# Patient Record
Sex: Male | Born: 2000
Health system: Southern US, Community
[De-identification: ages and names within clinical notes are randomized; demographics above are authoritative.]

---
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
## 2020-01-11 DIAGNOSIS — F411 Generalized anxiety disorder: Secondary | ICD-10-CM | POA: Diagnosis not present

## 2020-01-11 DIAGNOSIS — F4011 Social phobia, generalized: Secondary | ICD-10-CM | POA: Diagnosis not present

## 2020-01-11 DIAGNOSIS — F845 Asperger's syndrome: Secondary | ICD-10-CM | POA: Diagnosis not present

## 2020-02-01 DIAGNOSIS — F411 Generalized anxiety disorder: Secondary | ICD-10-CM | POA: Diagnosis not present

## 2020-02-01 DIAGNOSIS — F845 Asperger's syndrome: Secondary | ICD-10-CM | POA: Diagnosis not present

## 2020-02-01 DIAGNOSIS — F4011 Social phobia, generalized: Secondary | ICD-10-CM | POA: Diagnosis not present

## 2020-02-22 DIAGNOSIS — F4011 Social phobia, generalized: Secondary | ICD-10-CM | POA: Diagnosis not present

## 2020-02-22 DIAGNOSIS — F845 Asperger's syndrome: Secondary | ICD-10-CM | POA: Diagnosis not present

## 2020-02-22 DIAGNOSIS — F411 Generalized anxiety disorder: Secondary | ICD-10-CM | POA: Diagnosis not present

## 2020-03-21 DIAGNOSIS — F411 Generalized anxiety disorder: Secondary | ICD-10-CM | POA: Diagnosis not present

## 2020-03-21 DIAGNOSIS — F4011 Social phobia, generalized: Secondary | ICD-10-CM | POA: Diagnosis not present

## 2020-03-21 DIAGNOSIS — F845 Asperger's syndrome: Secondary | ICD-10-CM | POA: Diagnosis not present

## 2020-04-18 DIAGNOSIS — F4011 Social phobia, generalized: Secondary | ICD-10-CM | POA: Diagnosis not present

## 2020-04-18 DIAGNOSIS — F411 Generalized anxiety disorder: Secondary | ICD-10-CM | POA: Diagnosis not present

## 2020-04-18 DIAGNOSIS — F845 Asperger's syndrome: Secondary | ICD-10-CM | POA: Diagnosis not present

## 2020-05-16 DIAGNOSIS — F411 Generalized anxiety disorder: Secondary | ICD-10-CM | POA: Diagnosis not present

## 2020-05-16 DIAGNOSIS — F4011 Social phobia, generalized: Secondary | ICD-10-CM | POA: Diagnosis not present

## 2020-05-16 DIAGNOSIS — F845 Asperger's syndrome: Secondary | ICD-10-CM | POA: Diagnosis not present

## 2020-06-13 DIAGNOSIS — F4011 Social phobia, generalized: Secondary | ICD-10-CM | POA: Diagnosis not present

## 2020-06-13 DIAGNOSIS — F845 Asperger's syndrome: Secondary | ICD-10-CM | POA: Diagnosis not present

## 2020-06-13 DIAGNOSIS — F411 Generalized anxiety disorder: Secondary | ICD-10-CM | POA: Diagnosis not present

## 2020-07-11 DIAGNOSIS — F411 Generalized anxiety disorder: Secondary | ICD-10-CM | POA: Diagnosis not present

## 2020-07-11 DIAGNOSIS — F4011 Social phobia, generalized: Secondary | ICD-10-CM | POA: Diagnosis not present

## 2020-07-11 DIAGNOSIS — F845 Asperger's syndrome: Secondary | ICD-10-CM | POA: Diagnosis not present

## 2020-07-15 ENCOUNTER — Ambulatory Visit: Payer: BC Managed Care – PPO | Admitting: Psychiatry

## 2020-07-16 ENCOUNTER — Ambulatory Visit (INDEPENDENT_AMBULATORY_CARE_PROVIDER_SITE_OTHER): Payer: BC Managed Care – PPO | Admitting: Psychiatry

## 2020-07-16 ENCOUNTER — Other Ambulatory Visit: Payer: Self-pay

## 2020-07-16 ENCOUNTER — Encounter: Payer: Self-pay | Admitting: Psychiatry

## 2020-07-16 VITALS — Ht 72.0 in | Wt 125.0 lb

## 2020-07-16 DIAGNOSIS — F325 Major depressive disorder, single episode, in full remission: Secondary | ICD-10-CM | POA: Diagnosis not present

## 2020-07-16 DIAGNOSIS — F4010 Social phobia, unspecified: Secondary | ICD-10-CM | POA: Diagnosis not present

## 2020-07-16 DIAGNOSIS — F840 Autistic disorder: Secondary | ICD-10-CM | POA: Diagnosis not present

## 2020-07-16 NOTE — Progress Notes (Signed)
[facility]  Patient ID: Eric Robbins,  MRN: [id number]  PCP: Eric Ladd, MD  Date of Evaluation: 07/16/2020 Time spent:15 minutes from 1005 to 1020  Chief Complaint:  Chief Complaint    Anxiety; Paranoid; Depression      HISTORY/CURRENT STATUS: Eric Robbins is seen onsite in office 15 minutes face-to-face conjointly with father with consent with epic collateral for adolescent psychiatric interview and exam in 67-month evaluation and management of social anxiety, major depression in remission, and autism spectrum.  In the last year, Eric Robbins continued Celexa 20 mg daily and takes approximately 1 Xanax 0.5 mg tablet yearly.  Eric Robbins registry documents last dispensing for Xanax to be at time of last appointment 07/17/2019 #60 tablets rarely taking such as when he is too anxious to carry out an activity he needs to acquire.  He does not need a car to drive yet.  He may have gotten high school diploma but notes he graduated from [employer] last month.  He stopped Celexa somewhere early to mid June with father noting improvement off of the Celexa talking more with more energy, interest and confidence, though after he has graduated school.  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  He has monthly sessions with Dr. Denman Robbins virtually currently.  He has no plans yet for [facility] such as apprenticeship.  He has had no depression in the interim.  He has no mania, suicidality, psychosis or delirium.   Individual Medical History/ Review of Systems: Changes? :Yes   Allergies: Patient has no known allergies.  Current Medications:  Current Outpatient Medications:  .  ALPRAZolam (XANAX) 0.5 MG tablet, Take 1 tablet (0.5 mg total) by mouth 2 (two) times daily as needed for anxiety., Disp: 60 tablet, Rfl: 0  Medication Side Effects: confusion  relatively from Celexa so once he graduated he he was more social off Celexa  Family Medical/ Social History: Changes? No  MENTAL HEALTH EXAM:  Height 6' (1.829 m), weight 125 lb (56.7 kg).Body mass index is 16.95 kg/m. Muscle strengths and tone 5/5, postural reflexes and gait 0/0, and AIMS = 0.  General Appearance: Casual, Fairly Groomed and Guarded  Eye Contact:  Fair  Speech:  Clear and Coherent and Normal Rate  Volume:  Normal  Mood:  Anxious and Euthymic  Affect:  Congruent, Inappropriate, Restricted and Anxious  Thought Process:  Coherent, Irrelevant, Linear and Descriptions of Associations: Tangential  Orientation:  Full (Time, Place, and Person)  Thought Content: Ilusions, Rumination and Tangential   Suicidal Thoughts:  No  Homicidal Thoughts:  No  Memory:  Immediate;   Good Remote;   Fair  Judgement:  Fair  Insight:  Shallow  Psychomotor Activity:  Normal, Decreased and Mannerisms  Concentration:  Concentration: Fair and Attention Span: Fair  Recall:  [employer] of Knowledge: Fair  Language: Fair  Assets:  Leisure Time Resilience Talents/Skills  ADL's:  Intact  Cognition: WNL  Prognosis:  Fair    DIAGNOSES:    ICD-10-CM   1. Social anxiety disorder  F40.10   2. Major depressive disorder, single episode, in full remission ([facility])  F32.5   3. Autistic spectrum disorder  F84.0     Receiving Psychotherapy: Yes with Eric Shadow, PhD every month   RECOMMENDATIONS: Psychosupportive psychoeducation interactively family structurally consolidates symptom treatment matching for continuing  his discontinuation of Celexa from last month.  He does have Xanax 0.5 mg twice daily as needed current supply from 1 year ago #60 likely taking no more than 1 or 2  yearly.  He continues therapy with Dr. Denman Robbins and patient and  family prefer return again in 1 year or sooner if needed.  We reinforce and consolidate his progress with congratulations and next step options.   Eric Mann, MD

## 2020-10-15 ENCOUNTER — Encounter: Payer: Self-pay | Admitting: Psychiatry

## 2021-07-16 ENCOUNTER — Ambulatory Visit: Payer: BC Managed Care – PPO | Admitting: Psychiatry

## 2022-02-05 DIAGNOSIS — F411 Generalized anxiety disorder: Secondary | ICD-10-CM | POA: Diagnosis not present

## 2022-02-05 DIAGNOSIS — F845 Asperger's syndrome: Secondary | ICD-10-CM | POA: Diagnosis not present

## 2022-02-05 DIAGNOSIS — F4011 Social phobia, generalized: Secondary | ICD-10-CM | POA: Diagnosis not present

## 2022-04-02 DIAGNOSIS — F411 Generalized anxiety disorder: Secondary | ICD-10-CM | POA: Diagnosis not present

## 2022-04-02 DIAGNOSIS — F4011 Social phobia, generalized: Secondary | ICD-10-CM | POA: Diagnosis not present

## 2022-04-02 DIAGNOSIS — F845 Asperger's syndrome: Secondary | ICD-10-CM | POA: Diagnosis not present

## 2022-06-04 DIAGNOSIS — F411 Generalized anxiety disorder: Secondary | ICD-10-CM | POA: Diagnosis not present

## 2022-06-04 DIAGNOSIS — F845 Asperger's syndrome: Secondary | ICD-10-CM | POA: Diagnosis not present

## 2022-06-04 DIAGNOSIS — F4011 Social phobia, generalized: Secondary | ICD-10-CM | POA: Diagnosis not present

## 2022-07-30 DIAGNOSIS — F845 Asperger's syndrome: Secondary | ICD-10-CM | POA: Diagnosis not present

## 2022-07-30 DIAGNOSIS — F4011 Social phobia, generalized: Secondary | ICD-10-CM | POA: Diagnosis not present

## 2022-07-30 DIAGNOSIS — F411 Generalized anxiety disorder: Secondary | ICD-10-CM | POA: Diagnosis not present

## 2022-09-24 DIAGNOSIS — F411 Generalized anxiety disorder: Secondary | ICD-10-CM | POA: Diagnosis not present

## 2022-09-24 DIAGNOSIS — F4011 Social phobia, generalized: Secondary | ICD-10-CM | POA: Diagnosis not present

## 2022-09-24 DIAGNOSIS — F845 Asperger's syndrome: Secondary | ICD-10-CM | POA: Diagnosis not present

## 2022-10-21 ENCOUNTER — Emergency Department (HOSPITAL_COMMUNITY)
Admission: EM | Admit: 2022-10-21 | Discharge: 2022-10-21 | Disposition: A | Discharge status: Home / Self Care | Payer: BC Managed Care – PPO | Attending: Emergency Medicine | Admitting: Emergency Medicine

## 2022-10-21 ENCOUNTER — Other Ambulatory Visit: Payer: Self-pay

## 2022-10-21 ENCOUNTER — Encounter (HOSPITAL_COMMUNITY): Payer: Self-pay | Admitting: Emergency Medicine

## 2022-10-21 DIAGNOSIS — R1031 Right lower quadrant pain: Secondary | ICD-10-CM | POA: Insufficient documentation

## 2022-10-21 DIAGNOSIS — F840 Autistic disorder: Secondary | ICD-10-CM | POA: Diagnosis not present

## 2022-10-21 DIAGNOSIS — R112 Nausea with vomiting, unspecified: Secondary | ICD-10-CM | POA: Insufficient documentation

## 2022-10-21 DIAGNOSIS — E876 Hypokalemia: Secondary | ICD-10-CM | POA: Insufficient documentation

## 2022-10-21 DIAGNOSIS — D72829 Elevated white blood cell count, unspecified: Secondary | ICD-10-CM | POA: Diagnosis not present

## 2022-10-21 LAB — URINALYSIS, ROUTINE W REFLEX MICROSCOPIC
Bilirubin Urine: NEGATIVE
Glucose, UA: NEGATIVE mg/dL
Hgb urine dipstick: NEGATIVE
Ketones, ur: NEGATIVE mg/dL
Leukocytes,Ua: NEGATIVE
Nitrite: NEGATIVE
Protein, ur: NEGATIVE mg/dL
Specific Gravity, Urine: 1.031 — ABNORMAL HIGH (ref 1.005–1.030)
pH: 6 (ref 5.0–8.0)

## 2022-10-21 LAB — CBC WITH DIFFERENTIAL/PLATELET
Abs Immature Granulocytes: 0.03 10*3/uL (ref 0.00–0.07)
Basophils Absolute: 0.1 10*3/uL (ref 0.0–0.1)
Basophils Relative: 1 %
Eosinophils Absolute: 0.2 10*3/uL (ref 0.0–0.5)
Eosinophils Relative: 2 %
HCT: 45 % (ref 39.0–52.0)
Hemoglobin: 15.5 g/dL (ref 13.0–17.0)
Immature Granulocytes: 0 %
Lymphocytes Relative: 56 %
Lymphs Abs: 6 10*3/uL — ABNORMAL HIGH (ref 0.7–4.0)
MCH: 29.9 pg (ref 26.0–34.0)
MCHC: 34.4 g/dL (ref 30.0–36.0)
MCV: 86.7 fL (ref 80.0–100.0)
Monocytes Absolute: 0.9 10*3/uL (ref 0.1–1.0)
Monocytes Relative: 9 %
Neutro Abs: 3.4 10*3/uL (ref 1.7–7.7)
Neutrophils Relative %: 32 %
Platelets: 355 10*3/uL (ref 150–400)
RBC: 5.19 MIL/uL (ref 4.22–5.81)
RDW: 12 % (ref 11.5–15.5)
WBC: 10.6 10*3/uL — ABNORMAL HIGH (ref 4.0–10.5)
nRBC: 0 % (ref 0.0–0.2)

## 2022-10-21 LAB — COMPREHENSIVE METABOLIC PANEL
ALT: 23 U/L (ref 0–44)
AST: 25 U/L (ref 15–41)
Albumin: 3.9 g/dL (ref 3.5–5.0)
Alkaline Phosphatase: 66 U/L (ref 38–126)
Anion gap: 9 (ref 5–15)
BUN: 29 mg/dL — ABNORMAL HIGH (ref 6–20)
CO2: 25 mmol/L (ref 22–32)
Calcium: 8.7 mg/dL — ABNORMAL LOW (ref 8.9–10.3)
Chloride: 106 mmol/L (ref 98–111)
Creatinine, Ser: 1.45 mg/dL — ABNORMAL HIGH (ref 0.61–1.24)
GFR, Estimated: >60 mL/min (ref >60)
Glucose, Bld: 160 mg/dL — ABNORMAL HIGH (ref 70–99)
Potassium: 3.2 mmol/L — ABNORMAL LOW (ref 3.5–5.1)
Sodium: 140 mmol/L (ref 135–145)
Total Bilirubin: 1.1 mg/dL (ref 0.3–1.2)
Total Protein: 6.3 g/dL — ABNORMAL LOW (ref 6.5–8.1)

## 2022-10-21 LAB — LIPASE, BLOOD: Lipase: 38 U/L (ref 11–51)

## 2022-10-21 NOTE — ED Triage Notes (Signed)
Pt c/o RLQ abdominal pain and N/V x 1 hour

## 2022-10-21 NOTE — ED Provider Notes (Signed)
[facility] DEPT Provider Note   CSN: [id number] Arrival date & time: 10/21/22  0405     History  Chief Complaint  Patient presents with   Abdominal Pain    Eric Robbins is a 21 y.o. male.  The history is provided by the patient, a parent and medical records. No language interpreter was used.  Abdominal Pain    21 year old male significant history of anxiety, depression, autism, who presents with complaints of abdominal pain.  Patient report developing acute onset of pain to his right lower quadrant that started last night while he was sleeping.  States pain woke him up, described as a cramp sensation lasting for approximate 3 hours.  He did endorse feeling nauseous and did vomit during this episode.  Since being in the ED, his pain has subsided.  No report of fever but does endorse some mild chills.  No chest pain or shortness of breath productive cough no back pain, hematuria, penile discharge, penile pain, scrotal pain or testicle pain.  No specific treatment tried.  Reported currently pain-free.  No prior abdominal surgeries and patient has an intact appendix.  Home Medications Prior to Admission medications   Medication Sig Start Date End Date Taking? Authorizing Provider  ALPRAZolam Duanne Moron) 0.5 MG tablet Take 1 tablet (0.5 mg total) by mouth 2 (two) times daily as needed for anxiety. 07/17/19   Delight Hoh, MD      Allergies    Patient has no known allergies.    Review of Systems   Review of Systems  Gastrointestinal:  Positive for abdominal pain.  All other systems reviewed and are negative.   Physical Exam Updated Vital Signs BP 128/85 (BP Location: Left Arm)   Pulse 74   Temp 97.7 F (36.5 C) (Oral)   Resp 18   Ht 6' (1.829 m)   Wt 56.7 kg   SpO2 99%   BMI 16.95 kg/m  Physical Exam Vitals and nursing note reviewed.  Constitutional:      General: He is not in acute distress.    Appearance: He is well-developed.   HENT:     Head: Atraumatic.  Eyes:     Conjunctiva/sclera: Conjunctivae normal.  Cardiovascular:     Rate and Rhythm: Normal rate and regular rhythm.  Pulmonary:     Effort: Pulmonary effort is normal.     Breath sounds: Normal breath sounds.  Abdominal:     General: Abdomen is flat. Bowel sounds are normal.     Palpations: Abdomen is soft.     Tenderness: There is no abdominal tenderness. There is no right CVA tenderness or left CVA tenderness.     Hernia: No hernia is present.  Musculoskeletal:     Cervical back: Neck supple.  Skin:    Findings: No rash.  Neurological:     Mental Status: He is alert.     ED Results / Procedures / Treatments   Labs (all labs ordered are listed, but only abnormal results are displayed) Labs Reviewed  COMPREHENSIVE METABOLIC PANEL - Abnormal; Notable for the following components:      Result Value   Potassium 3.2 (*)    Glucose, Bld 160 (*)    BUN 29 (*)    Creatinine, Ser 1.45 (*)    Calcium 8.7 (*)    Total Protein 6.3 (*)    All other components within normal limits  CBC WITH DIFFERENTIAL/PLATELET - Abnormal; Notable for the following components:   WBC 10.6 (*)  Lymphs Abs 6.0 (*)    All other components within normal limits  URINALYSIS, ROUTINE W REFLEX MICROSCOPIC - Abnormal; Notable for the following components:   APPearance HAZY (*)    Specific Gravity, Urine 1.031 (*)    All other components within normal limits  LIPASE, BLOOD    EKG None  Radiology No results found.  Procedures Procedures    Medications Ordered in ED Medications - No data to display  ED Course/ Medical Decision Making/ A&P                           Medical Decision Making  BP 128/85 (BP Location: Left Arm)   Pulse 74   Temp 97.7 F (36.5 C) (Oral)   Resp 18   Ht 6' (1.829 m)   Wt 56.7 kg   SpO2 99%   BMI 16.95 kg/m   84:8 AM  21 year old male significant history of anxiety, depression, autism, who presents with complaints of  abdominal pain.  Patient report developing acute onset of pain to his right lower quadrant that started last night while he was sleeping.  States pain woke him up, described as a cramp sensation lasting for approximate 3 hours.  He did endorse feeling nauseous and did vomit during this episode.  Since being in the ED, his pain has subsided.  No report of fever but does endorse some mild chills.  No chest pain or shortness of breath productive cough no back pain, hematuria, penile discharge, penile pain, scrotal pain or testicle pain.  No specific treatment tried.  Reported currently pain-free.  No prior abdominal surgeries and patient has an intact appendix.  On exam, this is a well-appearing male appears to be in no acute discomfort.  Heart lung sounds normal.  Abdomen is soft nontender.  No tenderness to right lower quadrant.  Negative Murphy sign, no pain at McBurney's point.  Negative psoas and obturator sign.  He does not have any CVA tenderness.  GU exam deferred however patient denies having any testicular tenderness or scrotal tenderness or penile tenderness.  Labs obtained independently viewed interpreted by me.  Labs remarkable for signs of impaired renal function with a BUN of 29, creatinine of 1.45.  No prior value for comparison.  Mildly elevated CBG of 160.  Potassium is low at 3.2.  Urinalysis without signs of urinary tract infection or blood in urine to suggest kidney stone.  White count is minimally elevated at 10.6 and nonspecific.  Normal lipase.  Since patient does not have any reproducible pain on his abdominal exam and does not have any CVA tenderness and he is overall well-appearing, we discussed options of CT scan versus watchful waiting and serial abdominal exam.  Patient agreeable with serial abdominal exam and may return if symptoms progress.  Otherwise, I recommend patient to follow-up with PCP for recheck of his kidney function as it is impaired.  I also recommend eating a banana  daily to help with replenish his potassium.  All questions answered to patient's satisfaction.  At this time he is stable to be discharged.  This patient presents to the ED for concern of abd pain, this involves an extensive number of treatment options, and is a complaint that carries with it a high risk of complications and morbidity.  The differential diagnosis includes appendicitis, kidney stone, colitis, MSK, testicular torsion, hernia.  Co morbidities that complicate the patient evaluation none Additional history obtained:  Additional history obtained  from father External records from outside source obtained and reviewed including EMR  Lab Tests:  I Ordered, and personally interpreted labs.  The pertinent results include:  as above   Cardiac Monitoring:  The patient was maintained on a cardiac monitor.  I personally viewed and interpreted the cardiac monitored which showed an underlying rhythm of: NSR  Medicines ordered and prescription drug management:   I have reviewed the patients home medicines and have made adjustments as needed  Test Considered: abd/pelvis CT  Critical Interventions: none   Problem List / ED Course: RLQ pain  Reevaluation:  After the interventions noted above, I reevaluated the patient and found that they have :resolved  Social Determinants of Health: none  Dispostion:  After consideration of the diagnostic results and the patients response to treatment, I feel that the patent would benefit from outpt f/u.         Final Clinical Impression(s) / ED Diagnoses Final diagnoses:  RLQ abdominal pain    Rx / DC Orders ED Discharge Orders     None         Fayrene Helper, PA-C 10/21/22 1700    Jacalyn Lefevre, MD 10/21/22 [phone]

## 2022-10-21 NOTE — Discharge Instructions (Signed)
You have been evaluated for your abdominal pain.  At this time, we recommend watchful waiting and to return if your pain returns or worsen.  Otherwise you may take Tylenol as needed.  Follow-up with your primary care doctor in a week to recheck your kidney function as it is abnormal.

## 2022-10-29 DIAGNOSIS — Z13220 Encounter for screening for lipoid disorders: Secondary | ICD-10-CM | POA: Diagnosis not present

## 2022-10-29 DIAGNOSIS — Z1339 Encounter for screening examination for other mental health and behavioral disorders: Secondary | ICD-10-CM | POA: Diagnosis not present

## 2022-10-29 DIAGNOSIS — Z0000 Encounter for general adult medical examination without abnormal findings: Secondary | ICD-10-CM | POA: Diagnosis not present

## 2022-11-26 DIAGNOSIS — F4011 Social phobia, generalized: Secondary | ICD-10-CM | POA: Diagnosis not present

## 2022-11-26 DIAGNOSIS — F845 Asperger's syndrome: Secondary | ICD-10-CM | POA: Diagnosis not present

## 2022-11-26 DIAGNOSIS — F411 Generalized anxiety disorder: Secondary | ICD-10-CM | POA: Diagnosis not present

## 2023-01-28 DIAGNOSIS — F411 Generalized anxiety disorder: Secondary | ICD-10-CM | POA: Diagnosis not present

## 2023-01-28 DIAGNOSIS — F4011 Social phobia, generalized: Secondary | ICD-10-CM | POA: Diagnosis not present

## 2023-01-28 DIAGNOSIS — F845 Asperger's syndrome: Secondary | ICD-10-CM | POA: Diagnosis not present

## 2023-04-08 DIAGNOSIS — F845 Asperger's syndrome: Secondary | ICD-10-CM | POA: Diagnosis not present

## 2023-04-08 DIAGNOSIS — F411 Generalized anxiety disorder: Secondary | ICD-10-CM | POA: Diagnosis not present

## 2023-04-08 DIAGNOSIS — F4011 Social phobia, generalized: Secondary | ICD-10-CM | POA: Diagnosis not present

## 2023-07-07 DIAGNOSIS — F411 Generalized anxiety disorder: Secondary | ICD-10-CM | POA: Diagnosis not present

## 2023-07-07 DIAGNOSIS — F4011 Social phobia, generalized: Secondary | ICD-10-CM | POA: Diagnosis not present

## 2023-07-07 DIAGNOSIS — F845 Asperger's syndrome: Secondary | ICD-10-CM | POA: Diagnosis not present

## 2023-10-07 DIAGNOSIS — F845 Asperger's syndrome: Secondary | ICD-10-CM | POA: Diagnosis not present

## 2023-10-07 DIAGNOSIS — F4011 Social phobia, generalized: Secondary | ICD-10-CM | POA: Diagnosis not present

## 2023-10-07 DIAGNOSIS — F411 Generalized anxiety disorder: Secondary | ICD-10-CM | POA: Diagnosis not present

## 2023-11-04 DIAGNOSIS — Z0000 Encounter for general adult medical examination without abnormal findings: Secondary | ICD-10-CM | POA: Diagnosis not present

## 2023-11-04 DIAGNOSIS — R17 Unspecified jaundice: Secondary | ICD-10-CM | POA: Diagnosis not present

## 2023-11-04 DIAGNOSIS — Z1389 Encounter for screening for other disorder: Secondary | ICD-10-CM | POA: Diagnosis not present
